# Patient Record
Sex: Female | Born: 1990 | Race: Black or African American | Hispanic: No | Marital: Single | Smoking: Former smoker
Health system: Southern US, Community
[De-identification: ages and names within clinical notes are randomized; demographics above are authoritative.]

## PROBLEM LIST (undated history)

## (undated) DIAGNOSIS — Z789 Other specified health status: Secondary | ICD-10-CM

## (undated) HISTORY — PX: NO PAST SURGERIES: SHX2092

---

## 2020-03-15 ENCOUNTER — Inpatient Hospital Stay (HOSPITAL_COMMUNITY): Payer: BC Managed Care – PPO

## 2020-03-15 ENCOUNTER — Other Ambulatory Visit: Payer: Self-pay

## 2020-03-15 ENCOUNTER — Encounter (HOSPITAL_COMMUNITY): Payer: Self-pay | Admitting: Emergency Medicine

## 2020-03-15 ENCOUNTER — Inpatient Hospital Stay (HOSPITAL_COMMUNITY)
Admission: AD | Admit: 2020-03-15 | Discharge: 2020-03-15 | Disposition: A | Discharge status: Home / Self Care | Payer: BC Managed Care – PPO | Attending: Obstetrics & Gynecology | Admitting: Obstetrics & Gynecology

## 2020-03-15 DIAGNOSIS — R1030 Lower abdominal pain, unspecified: Secondary | ICD-10-CM | POA: Diagnosis not present

## 2020-03-15 DIAGNOSIS — Z87891 Personal history of nicotine dependence: Secondary | ICD-10-CM | POA: Insufficient documentation

## 2020-03-15 DIAGNOSIS — Z3A09 9 weeks gestation of pregnancy: Secondary | ICD-10-CM | POA: Diagnosis not present

## 2020-03-15 DIAGNOSIS — B9689 Other specified bacterial agents as the cause of diseases classified elsewhere: Secondary | ICD-10-CM

## 2020-03-15 DIAGNOSIS — O468X1 Other antepartum hemorrhage, first trimester: Secondary | ICD-10-CM

## 2020-03-15 DIAGNOSIS — R109 Unspecified abdominal pain: Secondary | ICD-10-CM

## 2020-03-15 DIAGNOSIS — Z3490 Encounter for supervision of normal pregnancy, unspecified, unspecified trimester: Secondary | ICD-10-CM

## 2020-03-15 DIAGNOSIS — O99891 Other specified diseases and conditions complicating pregnancy: Secondary | ICD-10-CM

## 2020-03-15 DIAGNOSIS — O209 Hemorrhage in early pregnancy, unspecified: Secondary | ICD-10-CM | POA: Insufficient documentation

## 2020-03-15 DIAGNOSIS — O26891 Other specified pregnancy related conditions, first trimester: Secondary | ICD-10-CM | POA: Diagnosis not present

## 2020-03-15 DIAGNOSIS — O418X10 Other specified disorders of amniotic fluid and membranes, first trimester, not applicable or unspecified: Secondary | ICD-10-CM

## 2020-03-15 DIAGNOSIS — N760 Acute vaginitis: Secondary | ICD-10-CM

## 2020-03-15 DIAGNOSIS — O26899 Other specified pregnancy related conditions, unspecified trimester: Secondary | ICD-10-CM

## 2020-03-15 HISTORY — DX: Other specified health status: Z78.9

## 2020-03-15 LAB — CBC
HCT: 38.4 % (ref 36.0–46.0)
Hemoglobin: 12.6 g/dL (ref 12.0–15.0)
MCH: 29.9 pg (ref 26.0–34.0)
MCHC: 32.8 g/dL (ref 30.0–36.0)
MCV: 91 fL (ref 80.0–100.0)
Platelets: 192 10*3/uL (ref 150–400)
RBC: 4.22 MIL/uL (ref 3.87–5.11)
RDW: 14.9 % (ref 11.5–15.5)
WBC: 11.3 10*3/uL — ABNORMAL HIGH (ref 4.0–10.5)
nRBC: 0 % (ref 0.0–0.2)

## 2020-03-15 LAB — URINALYSIS, ROUTINE W REFLEX MICROSCOPIC
Bacteria, UA: NONE SEEN
Bilirubin Urine: NEGATIVE
Glucose, UA: NEGATIVE mg/dL
Ketones, ur: NEGATIVE mg/dL
Leukocytes,Ua: NEGATIVE
Nitrite: NEGATIVE
Protein, ur: NEGATIVE mg/dL
Specific Gravity, Urine: 1.026 (ref 1.005–1.030)
pH: 5 (ref 5.0–8.0)

## 2020-03-15 LAB — WET PREP, GENITAL
Sperm: NONE SEEN
Trich, Wet Prep: NONE SEEN
Yeast Wet Prep HPF POC: NONE SEEN

## 2020-03-15 LAB — I-STAT BETA HCG BLOOD, ED (MC, WL, AP ONLY): I-stat hCG, quantitative: >2000 m[IU]/mL — ABNORMAL HIGH (ref <5)

## 2020-03-15 LAB — ABO/RH: ABO/RH(D): O POS

## 2020-03-15 LAB — HCG, QUANTITATIVE, PREGNANCY: hCG, Beta Chain, Quant, S: 77817 m[IU]/mL — ABNORMAL HIGH (ref <5)

## 2020-03-15 MED ORDER — METRONIDAZOLE 500 MG PO TABS: 500.0000 mg | ORAL_TABLET | Freq: Two times a day (BID) | ORAL | 0 refills | Status: AC

## 2020-03-15 MED ORDER — ACETAMINOPHEN 500 MG PO TABS
1000.0000 mg | ORAL_TABLET | Freq: Once | ORAL | Status: AC
  Administered 2020-03-15: 22:00:00 1000 mg via ORAL
  Filled 2020-03-15: qty 2

## 2020-03-15 MED ORDER — PREPLUS 27-1 MG PO TABS: 1.0000 | ORAL_TABLET | Freq: Every day | ORAL | 13 refills | Status: AC

## 2020-03-15 NOTE — MAU Provider Note (Signed)
History     CSN: [id number]  Arrival date and time: 03/15/20 0350   First Provider Initiated Contact with Patient 03/15/20 2126      Chief Complaint  Patient presents with  . Abdominal Pain   HPI Shannon Torres is a 29 y.o. [phone] in early pregnancy who presents to MAU with chief complaint of lower abdominal pain. This is a new problem, onset early this afternoon. Her pain is located bilaterally in her lower abdomen, rated as 8/10 and does not radiate. She has not taken medication or tried other treatments for this complaint. She denies aggravating or alleviating factors.   Patient also c/o abnormal vaginal discharge "like water running down my leg". She endorses foul smell. This is a new problem.  She denies abdominal tenderness, dysuria, fever or recent illness. Most recent sexual intercourse within the past week.   OB History    Gravida  5   Para  3   Term  3   Preterm      AB  1   Living  3     SAB  1   TAB      Ectopic      Multiple      Live Births  3           Past Medical History:  Diagnosis Date  . Medical history non-contributory     Past Surgical History:  Procedure Laterality Date  . NO PAST SURGERIES      History reviewed. No pertinent family history.  Social History   Tobacco Use  . Smoking status: Former Games developer  . Smokeless tobacco: Never Used  Substance Use Topics  . Alcohol use: Not Currently  . Drug use: Never    Allergies: Not on File  No medications prior to admission.    Review of Systems  Gastrointestinal: Positive for abdominal pain.  Genitourinary: Positive for vaginal discharge. Negative for dysuria, vaginal bleeding and vaginal pain.  Musculoskeletal: Negative for back pain.  All other systems reviewed and are negative.  Physical Exam   Blood pressure 135/83, pulse 72, temperature 98.9 F (37.2 C), temperature source Oral, resp. rate 20, height 5\' 7"  (1.702 m), weight 106.6 kg, SpO2 100  %.  Physical Exam  Nursing note and vitals reviewed. Constitutional: She is oriented to person, place, and time. She appears well-developed and well-nourished.  Cardiovascular: Normal rate and normal heart sounds.  Respiratory: Effort normal and breath sounds normal.  GI: Soft. Bowel sounds are normal. She exhibits no distension. There is no abdominal tenderness. There is no rebound, no guarding and no CVA tenderness.  Genitourinary:    Vaginal discharge present.     Genitourinary Comments: Swabs collected via blind swab. Thin white discharge noted on wet prep.    Neurological: She is alert and oriented to person, place, and time.  Skin: Skin is warm and dry.  Psychiatric: She has a normal mood and affect. Her behavior is normal. Judgment and thought content normal.    MAU Course  Procedures  Patient Vitals for the past 24 hrs:  BP Temp Temp src Pulse Resp SpO2 Height Weight  03/15/20 2235 138/89 -- -- -- -- -- -- --  03/15/20 2123 135/83 98.9 F (37.2 C) Oral 72 20 100 % -- --  03/15/20 1946 -- -- -- -- -- -- 5\' 7"  (1.702 m) 106.6 kg  03/15/20 1944 -- -- -- -- -- -- 5\' 7"  (1.702 m) 106.6 kg  03/15/20 1943 138/85 98.1 F (  36.7 C) Oral 79 14 100 % -- --   Results for orders placed or performed during the hospital encounter of 03/15/20 (from the past 24 hour(s))  I-Stat Beta hCG blood, ED ([facility], [facility], [facility] only)     Status: Abnormal   Collection Time: 03/15/20  8:05 PM  Result Value Ref Range   I-stat hCG, quantitative >2,000.0 (H) <5 mIU/mL   Comment 3          Urinalysis, Routine w reflex microscopic     Status: Abnormal   Collection Time: 03/15/20  8:44 PM  Result Value Ref Range   Color, Urine YELLOW YELLOW   APPearance CLEAR CLEAR   Specific Gravity, Urine 1.026 1.005 - 1.030   pH 5.0 5.0 - 8.0   Glucose, UA NEGATIVE NEGATIVE mg/dL   Hgb urine dipstick SMALL (A) NEGATIVE   Bilirubin Urine NEGATIVE NEGATIVE   Ketones, ur NEGATIVE NEGATIVE mg/dL   Protein, ur NEGATIVE  NEGATIVE mg/dL   Nitrite NEGATIVE NEGATIVE   Leukocytes,Ua NEGATIVE NEGATIVE   RBC / HPF 0-5 0 - 5 RBC/hpf   WBC, UA 0-5 0 - 5 WBC/hpf   Bacteria, UA NONE SEEN NONE SEEN   Squamous Epithelial / LPF 0-5 0 - 5   Mucus PRESENT   hCG, quantitative, pregnancy     Status: Abnormal   Collection Time: 03/15/20  8:53 PM  Result Value Ref Range   hCG, Beta Chain, Quant, S 77,817 (H) <5 mIU/mL  ABO/Rh     Status: None   Collection Time: 03/15/20  8:53 PM  Result Value Ref Range   ABO/RH(D) O POS    No rh immune globuloin      NOT A RH IMMUNE GLOBULIN CANDIDATE, PT RH POSITIVE Performed at [facility], [address] [place], [place] [address]   CBC     Status: Abnormal   Collection Time: 03/15/20  8:53 PM  Result Value Ref Range   WBC 11.3 (H) 4.0 - 10.5 K/uL   RBC 4.22 3.87 - 5.11 MIL/uL   Hemoglobin 12.6 12.0 - 15.0 g/dL   HCT 38.4 36.0 - 46.0 %   MCV 91.0 80.0 - 100.0 fL   MCH 29.9 26.0 - 34.0 pg   MCHC 32.8 30.0 - 36.0 g/dL   RDW 14.9 11.5 - 15.5 %   Platelets 192 150 - 400 K/uL   nRBC 0.0 0.0 - 0.2 %  Wet prep, genital     Status: Abnormal   Collection Time: 03/15/20  9:54 PM  Result Value Ref Range   Yeast Wet Prep HPF POC NONE SEEN NONE SEEN   Trich, Wet Prep NONE SEEN NONE SEEN   Clue Cells Wet Prep HPF POC PRESENT (A) NONE SEEN   WBC, Wet Prep HPF POC FEW (A) NONE SEEN   Sperm NONE SEEN    Meds ordered this encounter  Medications  . acetaminophen (TYLENOL) tablet 1,000 mg  . Prenatal Vit-Fe Fumarate-FA (PREPLUS) 27-1 MG TABS    Sig: Take 1 tablet by mouth daily.    Dispense:  30 tablet    Refill:  13    Order Specific Question:   Supervising Provider    Answer:   Elonda Husky, LUTHER H [[id number]]  . metroNIDAZOLE (FLAGYL) 500 MG tablet    Sig: Take 1 tablet (500 mg total) by mouth 2 (two) times daily.    Dispense:  14 tablet    Refill:  0    Order Specific Question:   Supervising Provider  Answer:   EURE, LUTHER H [[id number]]   US OB LESS THAN 14 WEEKS WITH OB  TRANSVAGINAL  Result Date: 03/15/2020 CLINICAL DATA:  Abdominal pain, first trimester pregnancy, quantitative beta HCG > 2000 EXAM: OBSTETRIC <14 WK [place] AND TRANSVAGINAL OB US TECHNIQUE: Both transabdominal and transvaginal ultrasound examinations were performed for complete evaluation of the gestation as well as the maternal uterus, adnexal regions, and pelvic cul-de-sac. Transvaginal technique was performed to assess early pregnancy. COMPARISON:  None FINDINGS: Intrauterine gestational sac: Present, single Yolk sac:  Not identified Embryo:  Present Cardiac Activity: Present Heart Rate: 169 bpm CRL:  26.9 mm   9 w   3 d                  [place] EDC: 10/15/2020 Subchorionic hemorrhage:  Small subchronic hemorrhage 11 x 8 x 15 mm Maternal uterus/adnexae: Maternal uterus otherwise unremarkable. LEFT ovary normal size and morphology 3.2 x 2.0 x 2.5 cm. RIGHT ovary normal size and morphology 2.7 x 1.9 x 1.7 cm. No free pelvic fluid or adnexal masses. IMPRESSION: Single live intrauterine gestation at 9 weeks 3 days EGA by crown-rump length. Small subchronic hemorrhage. Electronically Signed   By: Ulyses Southward M.D.   On: 03/15/2020 22:15   Assessment and Plan  --29 y.o. G5P3013 at [redacted]w[redacted]d by [place] --Subchorionic hematoma, discussed pelvic rest, falls precautions --Bacterial Vaginosis --Abdominal pain resolved with PO Tylenol --Paper rx per patient request --Discharge home in stable condition  Calvert Cantor, CNM 03/15/2020, 11:35 PM

## 2020-03-15 NOTE — ED Notes (Signed)
Report called to [facility] at [facility]U  Pt transported via wheelchair

## 2020-03-15 NOTE — Discharge Instructions (Signed)
Subchorionic Hematoma  A subchorionic hematoma is a gathering of blood between the outer wall of the embryo (chorion) and the inner wall of the womb (uterus). This condition can cause vaginal bleeding. If they cause little or no vaginal bleeding, early small hematomas usually shrink on their own and do not affect your baby or pregnancy. When bleeding starts later in pregnancy, or if the hematoma is larger or occurs in older pregnant women, the condition may be more serious. Larger hematomas may get bigger, which increases the chances of miscarriage. This condition also increases the risk of:  Premature separation of the placenta from the uterus.  Premature (preterm) labor.  Stillbirth. What are the causes? The exact cause of this condition is not known. It occurs when blood is trapped between the placenta and the uterine wall because the placenta has separated from the original site of implantation. What increases the risk? You are more likely to develop this condition if:  You were treated with fertility medicines.  You conceived through in vitro fertilization (IVF). What are the signs or symptoms? Symptoms of this condition include:  Vaginal spotting or bleeding.  Contractions of the uterus. These cause abdominal pain. Sometimes you may have no symptoms and the bleeding may only be seen when ultrasound images are taken (transvaginal ultrasound). How is this diagnosed? This condition is diagnosed based on a physical exam. This includes a pelvic exam. You may also have other tests, including:  Blood tests.  Urine tests.  Ultrasound of the abdomen. How is this treated? Treatment for this condition can vary. Treatment may include:  Watchful waiting. You will be monitored closely for any changes in bleeding. During this stage: ? The hematoma may be reabsorbed by the body. ? The hematoma may separate the fluid-filled space containing the embryo (gestational sac) from the wall of the  womb (endometrium).  Medicines.  Activity restriction. This may be needed until the bleeding stops. Follow these instructions at home:  Stay on bed rest if told to do so by your health care provider.  Do not lift anything that is heavier than 10 lbs. (4.5 kg) or as told by your health care provider.  Do not use any products that contain nicotine or tobacco, such as cigarettes and e-cigarettes. If you need help quitting, ask your health care provider.  Track and write down the number of pads you use each day and how soaked (saturated) they are.  Do not use tampons.  Keep all follow-up visits as told by your health care provider. This is important. Your health care provider may ask you to have follow-up blood tests or ultrasound tests or both. Contact a health care provider if:  You have any vaginal bleeding.  You have a fever. Get help right away if:  You have severe cramps in your stomach, back, abdomen, or pelvis.  You pass large clots or tissue. Save any tissue for your health care provider to look at.  You have more vaginal bleeding, and you faint or become lightheaded or weak. Summary  A subchorionic hematoma is a gathering of blood between the outer wall of the placenta and the uterus.  This condition can cause vaginal bleeding.  Sometimes you may have no symptoms and the bleeding may only be seen when ultrasound images are taken.  Treatment may include watchful waiting, medicines, or activity restriction. This information is not intended to replace advice given to you by your health care provider. Make sure you discuss any questions you   have with your health care provider. Document Revised: 11/11/2017 Document Reviewed: 01/25/2017 Elsevier Patient Education  2020 [employer]                        Safe Medications in Pregnancy    Acne: Benzoyl Peroxide Salicylic Acid  Backache/Headache: Tylenol: 2 regular strength every 4 hours OR              2 Extra  strength every 6 hours  Colds/Coughs/Allergies: Benadryl (alcohol free) 25 mg every 6 hours as needed Breath right strips Claritin Cepacol throat lozenges Chloraseptic throat spray Cold-Eeze- up to three times per day Cough drops, alcohol free Flonase (by prescription only) Guaifenesin Mucinex Robitussin DM (plain only, alcohol free) Saline nasal spray/drops Sudafed (pseudoephedrine) & Actifed ** use only after [redacted] weeks gestation and if you do not have high blood pressure Tylenol Vicks Vaporub Zinc lozenges Zyrtec   Constipation: Colace Ducolax suppositories Fleet enema Glycerin suppositories Metamucil Milk of magnesia Miralax Senokot Smooth move tea  Diarrhea: Kaopectate Imodium A-D  *NO pepto Bismol  Hemorrhoids: Anusol Anusol HC Preparation H Tucks  Indigestion: Tums Maalox Mylanta Zantac  Pepcid  Insomnia: Benadryl (alcohol free) 25mg every 6 hours as needed Tylenol PM Unisom, no Gelcaps  Leg Cramps: Tums MagGel  Nausea/Vomiting:  Bonine Dramamine Emetrol Ginger extract Sea bands Meclizine  Nausea medication to take during pregnancy:  Unisom (doxylamine succinate 25 mg tablets) Take one tablet daily at bedtime. If symptoms are not adequately controlled, the dose can be increased to a maximum recommended dose of two tablets daily (1/2 tablet in the morning, 1/2 tablet mid-afternoon and one at bedtime). Vitamin B6 100mg tablets. Take one tablet twice a day (up to 200 mg per day).  Skin Rashes: Aveeno products Benadryl cream or 25mg every 6 hours as needed Calamine Lotion 1% cortisone cream  Yeast infection: Gyne-lotrimin 7 Monistat 7   **If taking multiple medications, please check labels to avoid duplicating the same active ingredients **take medication as directed on the label ** Do not exceed 4000 mg of tylenol in 24 hours **Do not take medications that contain aspirin or ibuprofen           

## 2020-03-15 NOTE — MAU Note (Signed)
.  Shannon Torres is a 29 y.o. presents to MAU reporting sharp lower bilateral abdominal pain. Pt also reports an abnormal vaginal "smell" and states "my panties are always wet." LMP: Pt is unable to recall her last period Onset of complaint: abdominal pain began this afternoon Pain score: 8/10

## 2020-03-15 NOTE — ED Triage Notes (Signed)
Pt to ED with c/o lower abd pain onset earlier today.  Pt st's she is 2 months preg.  Pt denies any vag. bleeding

## 2020-03-17 LAB — GC/CHLAMYDIA PROBE AMP (~~LOC~~) NOT AT ARMC
Chlamydia: NEGATIVE
Comment: NEGATIVE
Comment: NORMAL
Neisseria Gonorrhea: NEGATIVE

## 2021-08-02 IMAGING — US US OB < 14 WEEKS - US OB TV
1 series · 15 of 19 positions shown · non-contrast
Comparison: None

CLINICAL DATA: Abdominal pain, first trimester pregnancy,
quantitative beta HCG > 3444

EXAM:
OBSTETRIC <14 WK US AND TRANSVAGINAL OB US
TECHNIQUE: Both transabdominal and transvaginal ultrasound examinations were
performed for complete evaluation of the gestation as well as the
maternal uterus, adnexal regions, and pelvic cul-de-sac.
Transvaginal technique was performed to assess early pregnancy.

[Series 1: us ob < 14 weeks - us ob tv · 15 of 19 slices shown]
[im 1/19]
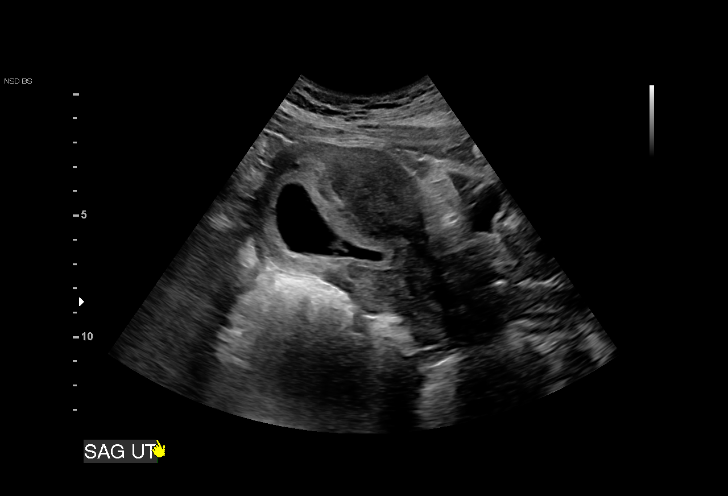
[im 2/19]
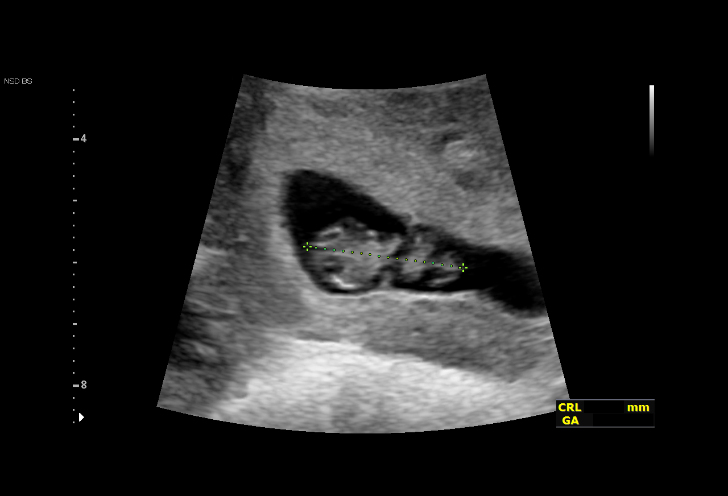
[im 4/19]
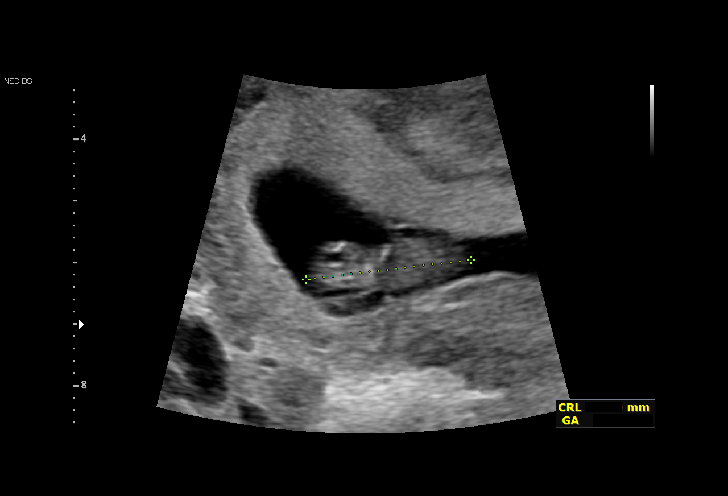
[im 5/19]
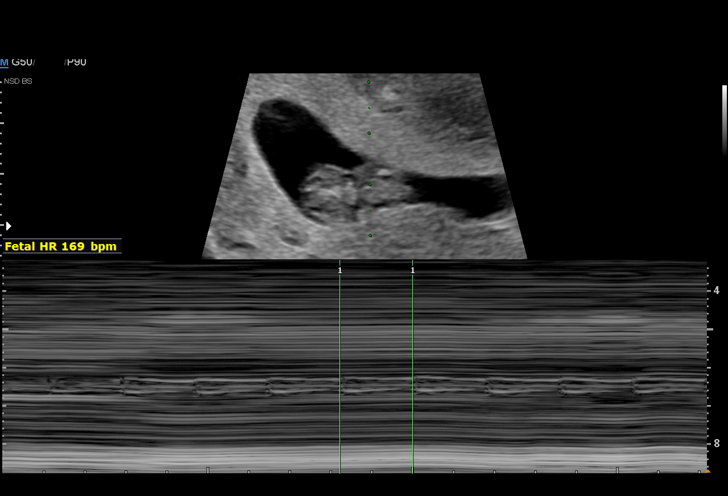
[im 6/19]
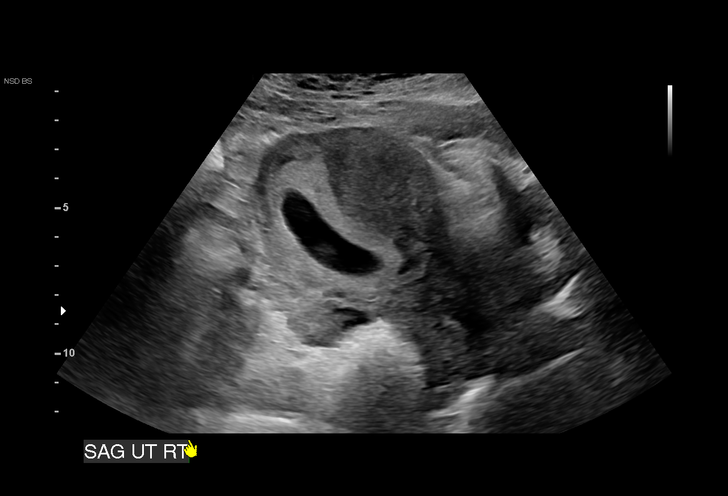
[im 7/19]
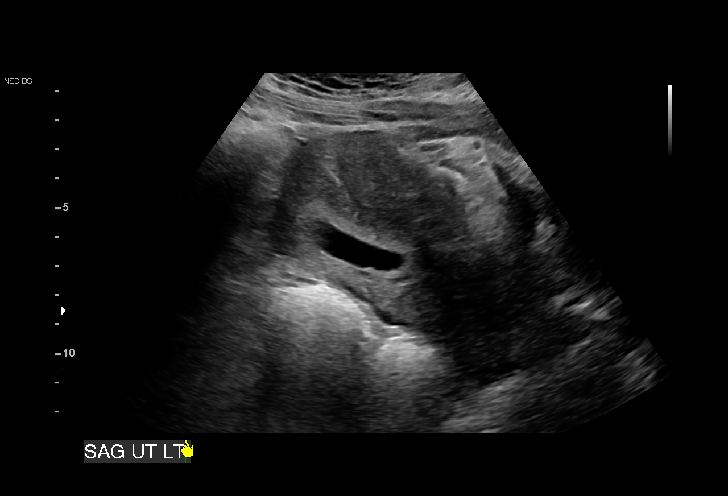
[im 9/19]
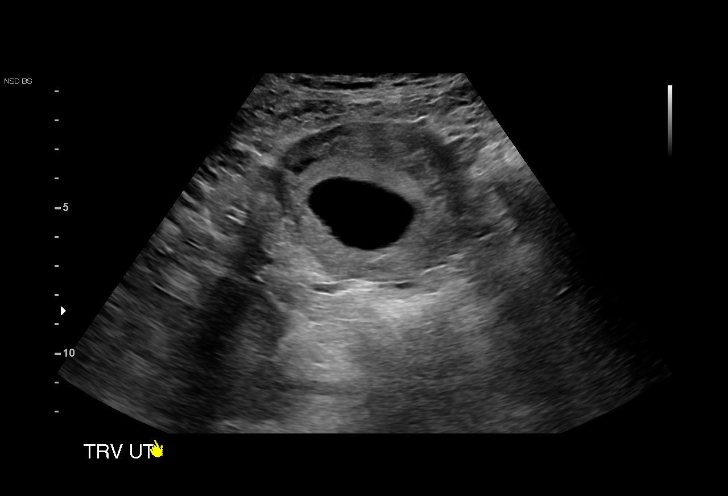
[im 10/19]
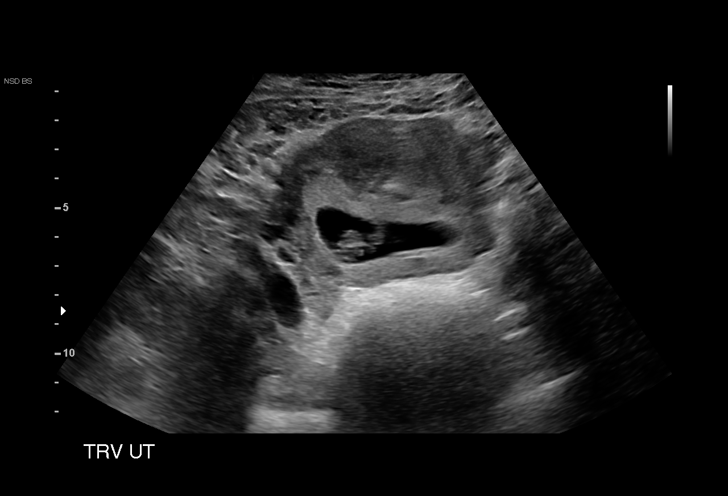
[im 11/19]
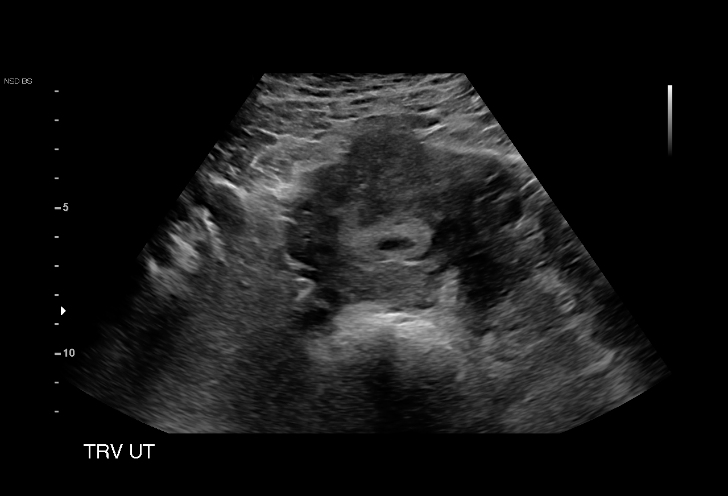
[im 13/19]
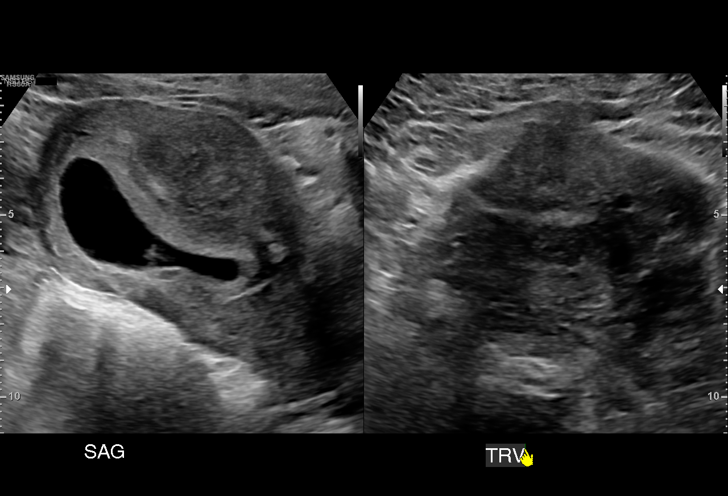
[im 14/19]
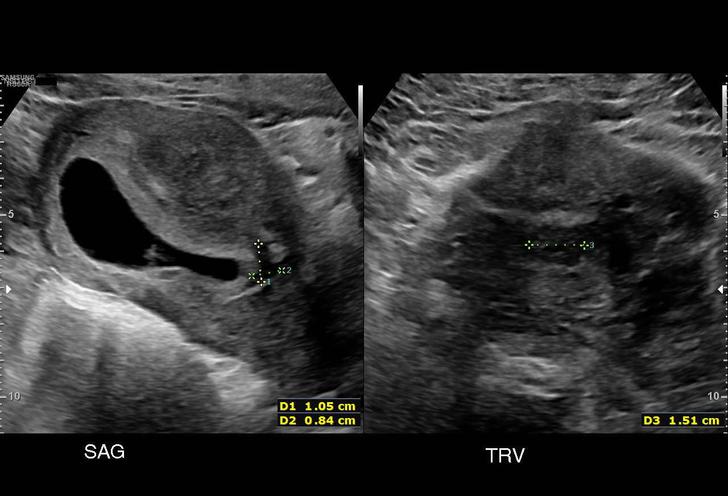
[im 15/19]
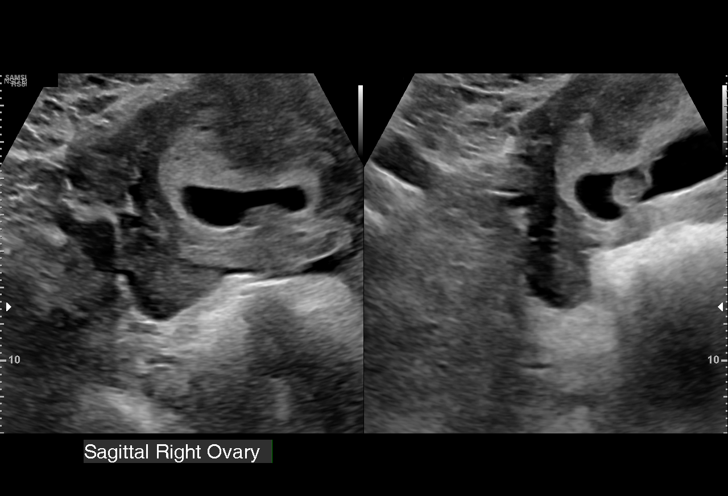
[im 16/19]
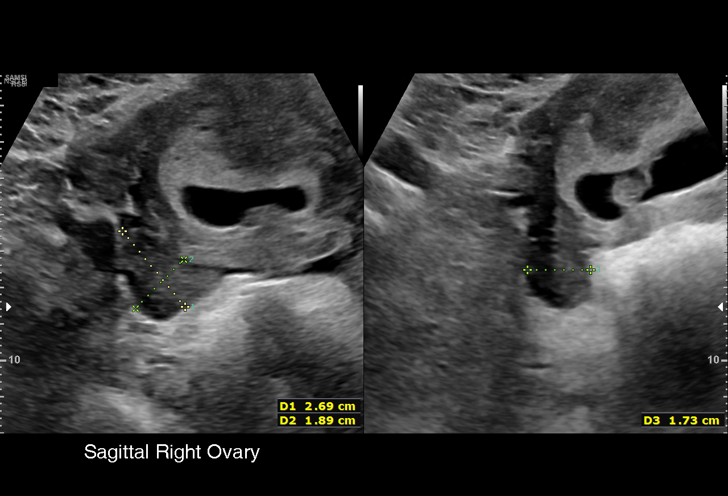
[im 18/19]
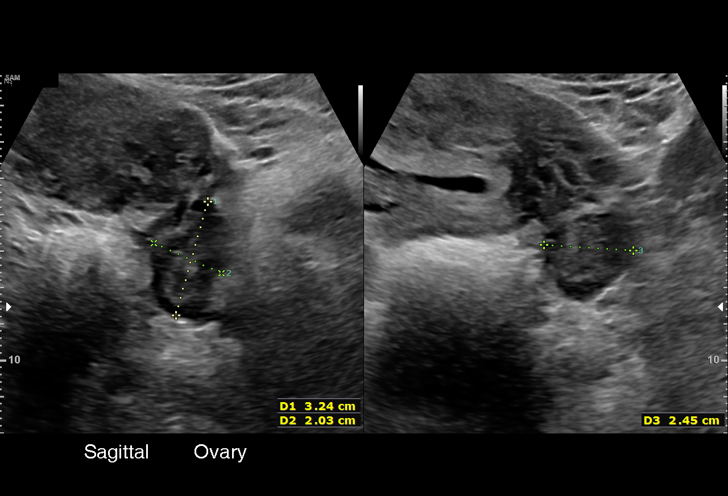
[im 19/19]
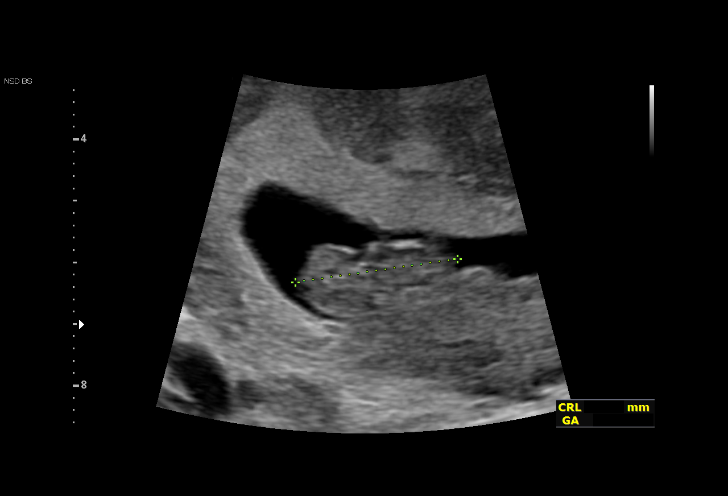

[15 of 19 positions shown; findings below may reference images not displayed]

FINDINGS: Intrauterine gestational sac: Present, single

Yolk sac:  Not identified

Embryo:  Present

Cardiac Activity: Present

Heart Rate: 169 bpm

CRL:  26.9 mm   9 w   3 d                  US EDC: 10/15/2020

Subchorionic hemorrhage:  Small subchronic hemorrhage 11 x 8 x 15 mm

Maternal uterus/adnexae:

Maternal uterus otherwise unremarkable.

LEFT ovary normal size and morphology 3.2 x 2.0 x 2.5 cm.

RIGHT ovary normal size and morphology 2.7 x 1.9 x 1.7 cm.

No free pelvic fluid or adnexal masses.
IMPRESSION: Single live intrauterine gestation at 9 weeks 3 days EGA by
crown-rump length.

Small subchronic hemorrhage.
